# Patient Record
Sex: Female | Born: 1995 | Hispanic: Yes | Marital: Single | Smoking: Never smoker
Health system: Southern US, Community
[De-identification: ages and names within clinical notes are randomized; demographics above are authoritative.]

## PROBLEM LIST (undated history)

## (undated) DIAGNOSIS — E669 Obesity, unspecified: Secondary | ICD-10-CM

## (undated) DIAGNOSIS — I10 Essential (primary) hypertension: Secondary | ICD-10-CM

## (undated) DIAGNOSIS — Z68.41 Body mass index (BMI) pediatric, greater than or equal to 95th percentile for age: Secondary | ICD-10-CM

## (undated) DIAGNOSIS — N926 Irregular menstruation, unspecified: Secondary | ICD-10-CM

## (undated) HISTORY — DX: Essential (primary) hypertension: I10

## (undated) HISTORY — DX: Obesity, unspecified: E66.9

## (undated) HISTORY — DX: Body mass index (bmi) pediatric, greater than or equal to 95th percentile for age: Z68.54

## (undated) HISTORY — DX: Irregular menstruation, unspecified: N92.6

---
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
## 2015-09-04 ENCOUNTER — Encounter: Payer: Self-pay | Admitting: Pediatrics

## 2015-12-28 ENCOUNTER — Other Ambulatory Visit: Payer: Self-pay | Admitting: Pediatrics

## 2015-12-28 DIAGNOSIS — E282 Polycystic ovarian syndrome: Secondary | ICD-10-CM

## 2016-08-16 ENCOUNTER — Encounter: Payer: Self-pay | Admitting: Pediatrics

## 2016-08-16 ENCOUNTER — Ambulatory Visit (INDEPENDENT_AMBULATORY_CARE_PROVIDER_SITE_OTHER): Payer: Medicaid Other | Admitting: Pediatrics

## 2016-08-16 VITALS — BP 146/84 | Temp 97.6°F | Wt 265.2 lb

## 2016-08-16 DIAGNOSIS — Z3202 Encounter for pregnancy test, result negative: Secondary | ICD-10-CM | POA: Diagnosis not present

## 2016-08-16 DIAGNOSIS — L732 Hidradenitis suppurativa: Secondary | ICD-10-CM | POA: Diagnosis not present

## 2016-08-16 LAB — POCT URINE PREGNANCY: Preg Test, Ur: NEGATIVE

## 2016-08-16 MED ORDER — DOXYCYCLINE HYCLATE 50 MG PO CAPS: 100.0000 mg | ORAL_CAPSULE | Freq: Two times a day (BID) | ORAL | 0 refills | Status: AC

## 2016-08-16 NOTE — Patient Instructions (Addendum)
Please take doxycycline 100 mg 2 times per day for 1 month.   If you have persistent fevers, feel very tired, stop eating, or notice a change in activity level, please call office or be seen by a medical provider.   Please follow up with general surgery:  [facility] [phone] [address], [place], [place] [address]  Please follow up with family medicine to establish care for health maintenance:  [facility] [phone] [facility] [phone].

## 2016-08-16 NOTE — Progress Notes (Signed)
Subjective:    Julia Miles is a 21 y.o. old female here with her mother for Fever (UTD shots. temp to 104 last Thurs, mostly resolved, "felt warm yesterday". ) and Recurrent Skin Infections (c/o "abcesses in armpits" over past month, they come and go. )  HPI  Patient reports fevers and a "rash" that started one month ago, these symptoms were resolved for 2-3 weeks, then came back. Patient reports that these symptoms occur with abscesses. Temperature recorded at home with a Tmax of 104 F last Wednesday 08/11/2016.  Around that time, the cysts began draining and the fever resolved.  Patient does not think she has had a fever since that one day.  Patient states the cysts occurred about a month ago in same location, went away for about 2-3 weeks, and then came back this past week.  Patient is feeling a little better today but still having pain underneath both arms. Cysts are located at bilateral axilla. Today patient still feels warm and diaphoretic but no chills; she has not had documented "fever" since 08/11/16.   Phone and video interpreter services used for history/physical exam and plan.   Review of Systems  Pain all over body last week - resolving Localized Headache No change in BM, UOP, sob, cp.  No change in activity.   History and Problem List: Julia Miles has Body mass index, pediatric, greater than or equal to 95th percentile for age; Obesity, unspecified; Unspecified essential hypertension; Tonsillar hypertrophy; History of snoring; Lower abdominal pain; PCOS (polycystic ovarian syndrome); Vitamin D deficiency; and Hidradenitis suppurativa on her problem list.  Julia Miles  has a past medical history of Body mass index, pediatric, greater than or equal to 95th percentile for age (10/17/2012); Hypertension; Irregular menses (06/10/2014); Obesity; and Unspecified essential hypertension (11/06/2013).  Immunizations needed: none     Objective:    BP (!) 146/84   Temp 97.6 F (36.4 C) (Temporal)   Wt 265 lb  3.2 oz (120.3 kg)   BMI 50.11 kg/m  Physical Exam  General: no apparent distress, increased body habitus, sitting comfortably in chair HENT: PERRL, EOMI, MMM, Neck: supple, full ROM, no LAD Respiratory: CTAB, no wheezing, unlabored breathing. Cardiovascular: RRR, normal S1/S2, no murmurs appreciated, cap refill < 3 seconds Abdomen: soft, nontender, bowel sounds present, no HSM Musculoskeletal: spontaneous movement of all 4 extremities Neuro: alert, interactive, good tone Skin: warm, dry, no rashes, no petechiae, no ecchymoses Bilateral axilla: indurated throughout axilla fold approximately 8 x 4 cm, ulcerated at center with blood at surface, no drainage, no pus expressible, center nodule tender to light palpation, warm, no areas of skin infection or necrosis     Assessment and Plan:     Julia Miles was seen today for Fever (UTD shots. temp to 104 last Thurs, mostly resolved, "felt warm yesterday". ) and Recurrent Skin Infections (c/o "abcesses in armpits" over past month, they come and go. )  Julia Miles presented with one day of fever (now afebrile) and multiple abscesses in the bilateral axilla. Her increased body weight, along with clinical symptoms and physical exam findings supports the diagnosis of Hidradenitis suppurativa. Given the recurrent inflamed nodules, abscesses, inclusion of sinus tracts and scarring is consistent with a Hurley stage II HS diagnosis.   We recommended starting Doxycycline at 100 mg bid for 30 days. Urine pregnancy test was negative in clinic today. Patient also referred to general surgery for further evaluation and possible surgical intervention of HS.   We also educated patient on the benefits of weight  loss and its ability to help with HS.   Another referral was made for Family medicine for transition of care to manage weight, high blood pressure and for health maintenance as she is now 21 years old.  Discussed that weight loss and healthy lifestyle changes were also  necessary for her BP.  BP needs to be rechecked after attempting lifestyle changes.   Patient requested Change number on record: patient's phone number [phone]  Problem List Items Addressed This Visit    Hidradenitis suppurativa - Primary   Relevant Medications   doxycycline (VIBRAMYCIN) 50 MG capsule   Other Relevant Orders   Ambulatory referral to General Surgery    Other Visit Diagnoses    Negative pregnancy test       Relevant Orders   POCT urine pregnancy (Completed)      Return if symptoms worsen or fail to improve, high fevers.  Julia Leigh, MD

## 2016-08-16 NOTE — Progress Notes (Signed)
Suggestions for adult care offices:  [facility] [phone] [facility] [phone].

## 2018-11-17 ENCOUNTER — Other Ambulatory Visit: Payer: Self-pay

## 2018-11-17 DIAGNOSIS — Z20822 Contact with and (suspected) exposure to covid-19: Secondary | ICD-10-CM

## 2018-11-18 LAB — NOVEL CORONAVIRUS, NAA: SARS-CoV-2, NAA: NOT DETECTED
